# Patient Record
Sex: Male | Born: 1975 | Race: White | Hispanic: No | Marital: Married | Smoking: Current every day smoker
Health system: Southern US, Community
[De-identification: ages and names within clinical notes are randomized; demographics above are authoritative.]

## PROBLEM LIST (undated history)

## (undated) DIAGNOSIS — I10 Essential (primary) hypertension: Secondary | ICD-10-CM

## (undated) DIAGNOSIS — G8929 Other chronic pain: Secondary | ICD-10-CM

## (undated) DIAGNOSIS — M545 Low back pain: Secondary | ICD-10-CM

## (undated) DIAGNOSIS — F329 Major depressive disorder, single episode, unspecified: Secondary | ICD-10-CM

## (undated) DIAGNOSIS — J42 Unspecified chronic bronchitis: Secondary | ICD-10-CM

## (undated) DIAGNOSIS — E7800 Pure hypercholesterolemia, unspecified: Secondary | ICD-10-CM

## (undated) DIAGNOSIS — Z85028 Personal history of other malignant neoplasm of stomach: Secondary | ICD-10-CM

## (undated) DIAGNOSIS — R51 Headache: Secondary | ICD-10-CM

## (undated) DIAGNOSIS — J189 Pneumonia, unspecified organism: Secondary | ICD-10-CM

## (undated) DIAGNOSIS — F32A Depression, unspecified: Secondary | ICD-10-CM

## (undated) DIAGNOSIS — K219 Gastro-esophageal reflux disease without esophagitis: Secondary | ICD-10-CM

## (undated) DIAGNOSIS — F319 Bipolar disorder, unspecified: Secondary | ICD-10-CM

## (undated) DIAGNOSIS — M1990 Unspecified osteoarthritis, unspecified site: Secondary | ICD-10-CM

## (undated) DIAGNOSIS — F419 Anxiety disorder, unspecified: Secondary | ICD-10-CM

## (undated) DIAGNOSIS — T4145XA Adverse effect of unspecified anesthetic, initial encounter: Secondary | ICD-10-CM

## (undated) DIAGNOSIS — T8859XA Other complications of anesthesia, initial encounter: Secondary | ICD-10-CM

## (undated) DIAGNOSIS — R0602 Shortness of breath: Secondary | ICD-10-CM

## (undated) HISTORY — PX: CHOLECYSTECTOMY: SHX55

## (undated) HISTORY — PX: HIP SURGERY: SHX245

## (undated) HISTORY — PX: FINGER FRACTURE SURGERY: SHX638

## (undated) HISTORY — PX: MULTIPLE TOOTH EXTRACTIONS: SHX2053

---

## 2005-05-18 ENCOUNTER — Emergency Department (HOSPITAL_COMMUNITY): Admission: EM | Admit: 2005-05-18 | Discharge: 2005-05-18 | Payer: Self-pay | Admitting: Emergency Medicine

## 2006-09-23 ENCOUNTER — Emergency Department (HOSPITAL_COMMUNITY): Admission: EM | Admit: 2006-09-23 | Discharge: 2006-09-23 | Payer: Self-pay | Admitting: Emergency Medicine

## 2009-01-14 ENCOUNTER — Emergency Department (HOSPITAL_COMMUNITY): Admission: EM | Admit: 2009-01-14 | Discharge: 2009-01-14 | Payer: Self-pay | Admitting: Emergency Medicine

## 2009-01-14 ENCOUNTER — Ambulatory Visit (HOSPITAL_COMMUNITY): Admission: RE | Admit: 2009-01-14 | Discharge: 2009-01-14 | Payer: Self-pay | Admitting: Family Medicine

## 2011-02-01 LAB — COMPREHENSIVE METABOLIC PANEL
ALT: 143 — ABNORMAL HIGH
AST: 93 — ABNORMAL HIGH
Albumin: 3.6
Calcium: 8.7
Glucose, Bld: 143 — ABNORMAL HIGH
Total Bilirubin: 0.7

## 2011-02-01 LAB — CBC
HCT: 43.5
MCHC: 35.4
MCV: 90.1
Platelets: 256
RBC: 4.83
WBC: 13.6 — ABNORMAL HIGH

## 2011-02-01 LAB — DIFFERENTIAL
Eosinophils Relative: 4
Lymphs Abs: 2.4
Monocytes Relative: 5
Neutro Abs: 9.9 — ABNORMAL HIGH
Neutrophils Relative %: 73

## 2011-02-01 LAB — LIPASE, BLOOD: Lipase: 51

## 2011-02-01 LAB — URINALYSIS, ROUTINE W REFLEX MICROSCOPIC
Specific Gravity, Urine: >1.03 — ABNORMAL HIGH
Urobilinogen, UA: 2 — ABNORMAL HIGH

## 2013-07-06 ENCOUNTER — Encounter (HOSPITAL_COMMUNITY): Payer: Self-pay | Admitting: Emergency Medicine

## 2013-07-06 DIAGNOSIS — K089 Disorder of teeth and supporting structures, unspecified: Secondary | ICD-10-CM | POA: Insufficient documentation

## 2013-07-06 DIAGNOSIS — K029 Dental caries, unspecified: Secondary | ICD-10-CM | POA: Insufficient documentation

## 2013-07-06 DIAGNOSIS — F17200 Nicotine dependence, unspecified, uncomplicated: Secondary | ICD-10-CM | POA: Insufficient documentation

## 2013-07-06 DIAGNOSIS — K047 Periapical abscess without sinus: Secondary | ICD-10-CM | POA: Insufficient documentation

## 2013-07-06 NOTE — ED Notes (Signed)
Pt c/o dental pain since Friday. Pt states he broke his tooth in his sleep.

## 2013-07-07 ENCOUNTER — Emergency Department (HOSPITAL_COMMUNITY)
Admission: EM | Admit: 2013-07-07 | Discharge: 2013-07-07 | Disposition: A | Discharge status: Home / Self Care | Payer: Medicare Other | Attending: Emergency Medicine | Admitting: Emergency Medicine

## 2013-07-07 DIAGNOSIS — K0889 Other specified disorders of teeth and supporting structures: Secondary | ICD-10-CM

## 2013-07-07 MED ORDER — CLINDAMYCIN HCL 150 MG PO CAPS: 150.0000 mg | ORAL_CAPSULE | Freq: Four times a day (QID) | ORAL | Status: DC

## 2013-07-07 MED ORDER — HYDROCODONE-ACETAMINOPHEN 7.5-325 MG/15ML PO SOLN: 10.0000 mL | Freq: Four times a day (QID) | ORAL | Status: DC | PRN

## 2013-07-07 MED ORDER — IBUPROFEN 800 MG PO TABS: 800.0000 mg | ORAL_TABLET | Freq: Three times a day (TID) | ORAL | Status: DC

## 2013-07-07 MED ORDER — PENICILLIN V POTASSIUM 500 MG PO TABS: 500.0000 mg | ORAL_TABLET | Freq: Four times a day (QID) | ORAL | Status: AC

## 2013-07-07 NOTE — Discharge Instructions (Signed)
Dental Pain °A tooth ache may be caused by cavities (tooth decay). Cavities expose the nerve of the tooth to air and hot or cold temperatures. It may come from an infection or abscess (also called a boil or furuncle) around your tooth. It is also often caused by dental caries (tooth decay). This causes the pain you are having. °DIAGNOSIS  °Your caregiver can diagnose this problem by exam. °TREATMENT  °· If caused by an infection, it may be treated with medications which kill germs (antibiotics) and pain medications as prescribed by your caregiver. Take medications as directed. °· Only take over-the-counter or prescription medicines for pain, discomfort, or fever as directed by your caregiver. °· Whether the tooth ache today is caused by infection or dental disease, you should see your dentist as soon as possible for further care. °SEEK MEDICAL CARE IF: °The exam and treatment you received today has been provided on an emergency basis only. This is not a substitute for complete medical or dental care. If your problem worsens or new problems (symptoms) appear, and you are unable to meet with your dentist, call or return to this location. °SEEK IMMEDIATE MEDICAL CARE IF:  °· You have a fever. °· You develop redness and swelling of your face, jaw, or neck. °· You are unable to open your mouth. °· You have severe pain uncontrolled by pain medicine. °MAKE SURE YOU:  °· Understand these instructions. °· Will watch your condition. °· Will get help right away if you are not doing well or get worse. °Document Released: 04/02/2005 Document Revised: 06/25/2011 Document Reviewed: 11/19/2007 °ExitCare® Patient Information ©2014 [employer]. °  Emergency Department Resource Guide °1) Find a Doctor and Pay Out of Pocket °Although you won't have to find out who is covered by your insurance plan, it is a good idea to ask around and get recommendations. You will then need to call the office and see if the doctor you have chosen will  accept you as a new patient and what types of options they offer for patients who are self-pay. Some doctors offer discounts or will set up payment plans for their patients who do not have insurance, but you will need to ask so you aren't surprised when you get to your appointment. ° °2) Contact Your Local Health Department °Not all health departments have doctors that can see patients for sick visits, but many do, so it is worth a call to see if yours does. If you don't know where your local health department is, you can check in your phone book. The CDC also has a tool to help you locate your state's health department, and many state websites also have listings of all of their local health departments. ° °3) Find a Walk-in Clinic °If your illness is not likely to be very severe or complicated, you may want to try a walk in clinic. These are popping up all over the country in pharmacies, drugstores, and shopping centers. They're usually staffed by nurse practitioners or physician assistants that have been trained to treat common illnesses and complaints. They're usually fairly quick and inexpensive. However, if you have serious medical issues or chronic medical problems, these are probably not your best option. ° °No Primary Care Doctor: °- Call [facility] at  832-8000 - they can help you locate a primary care doctor that  accepts your insurance, provides certain services, etc. °- Physician Referral Service- 1-800-533-3463 ° °Chronic Pain Problems: °Organization           Address  Phone   Notes  °[facility]  (336) 297-2271 Patients need to be referred by their primary care doctor.  ° °Medication Assistance: °Organization         Address  Phone   Notes  °[place] [facility] [address] °[place], [place] [address] (336) 641-8030 --Must be a resident of [place] °-- Must have NO insurance coverage whatsoever (no Medicaid/ Medicare, etc.) °-- The pt.  MUST have a primary care doctor that directs their care regularly and follows them in the community °  °[facility]  (866) 331-1348   °[employer]  (888) 892-1162   ° °Agencies that provide inexpensive medical care: °Organization         Address  Phone   Notes  °[facility]  (336) 832-8035   °[facility]    (336) 832-7272   °[facility] [address] °[place], [place] [address] (336) 832-4777   °[facility] [address], °[place] (336) 271-4999   °[facility]    (336) 373-0678   °[facility]    (336) 272-1050   °[facility] ° [address], [place] Phone:  (336) 832-4444, Fax:  (336) 832-4440 Hours of Operation:  9 am - 6 pm, M-F.  Also accepts Medicaid/Medicare and self-pay.  °[facility] ° [address], [place] Phone: (336) 832-3150, Fax: (336) 832-3151. Hours of Operation:  8:30 am - 5:30 pm, M-F.  Also accepts Medicaid and self-pay.  °[facility] [place] [address], [place] Phone: (336) 878-6027   °[facility] [address], [place], [place] (336)723-1848, Ext. 123 Mondays & Thursdays: 7-9 AM.  First 15 patients are seen on a first come, first serve basis. °  ° °Medicaid-accepting [place] Providers: ° °Organization         Address  Phone   Notes  °[facility] [address], [place] (336) 641-2100 Also accepts self-pay patients.  °[facility] [address], [place] ° (336) 856-9996   °[facility] [address], [place] (336) 288-8857   °[facility] [address] [place] [address], [place] (336) 299-7000   °Veita Bland [address], [place]  ° (336) 373-1557 Only accepts [place] Access Medicaid patients after they have their name applied to their card.  ° °Self-Pay (no insurance) in  [place]: ° °Organization         Address  Phone   Notes  °Sickle Cell Patients, [facility] [address], [place] (336) 832-1970   °[facility] [address], [place] (336) 832-4400   ° [facility] ° [address], [place] (336) 992-4800   °[facility]/Dr. Osei-Bonsu ° [address] [place] [address], [place] or [address], [place] (336) 841-8500 Phone number for both [place] and [place] locations is the same.  °[facility] [address], [place] (336) 299-0000   °[facility] [address] [place] [address], [place] or [address] (336) 852-7530 °(336) 878-2260   °[facility] [address], [place] (336) 350-1642, phone; (336) 294-5005, fax Sees patients 1st and 3rd Saturday of every month.  Must not qualify   for public or private insurance (i.e. Medicaid, Medicare, [place] Health Choice, Veterans' Benefits) • Household income should be no more than 200% of the poverty level •The clinic cannot treat you if you are pregnant or think you are pregnant • Sexually transmitted diseases are not treated at the clinic.  ° °Dental Care: °Organization         Address  Phone  Notes  °[facility] [facility] [address], [place] (336) 641-6152 Accepts children up to age 21 who are enrolled in Medicaid or [place] Health Choice; pregnant women with a Medicaid card; and children who have applied for Medicaid or [place] Health Choice, but were declined, whose parents can pay a reduced fee at time of service.  °[facility] [place]  [address], [place] (336) 641-7733 Accepts children up to age 21 who are enrolled in Medicaid or [place] Health Choice; pregnant women with a Medicaid card; and children who have applied for Medicaid or [place] Health Choice, but were declined, whose parents  can pay a reduced fee at time of service.  °[facility] ° [address], [place] (336) 641-4533 Patients are seen by appointment only. Walk-ins are not accepted. [facility] will see patients 18 years of age and older. °Monday - Tuesday (8am-5pm) °Most Wednesdays (8:30-5pm) °$30 per visit, cash only  °[facility] ° [address], [place] (336) 641-4533 Patients are seen by appointment only. Walk-ins are not accepted. [facility] will see patients 18 years of age and older. °One Wednesday Evening (Monthly: Volunteer Based).  $30 per visit, cash only  °[facility]  (919) 537-3737 for adults; Children under age 4, call Graduate Pediatric Dentistry at (919) [phone]. Children aged 4-14, please call (919) 537-3737 to request a pediatric application. ° Dental services are provided in all areas of dental care including fillings, crowns and bridges, complete and partial dentures, implants, gum treatment, root canals, and extractions. Preventive care is also provided. Treatment is provided to both adults and children. °Patients are selected via a lottery and there is often a waiting list. °  °[facility] [address], °[place] ° (336) 763-8833 [email or web address].drcivils.com °  °[facility] [address], [place], [place] (336)723-1848, Ext. 123 Second and Fourth Thursday of each month, opens at 6:30 AM; Clinic ends at 9 AM.  Patients are seen on a first-come first-served basis, and a limited number are seen during each clinic.  ° °[facility] ° [address], [place], [place] (336) 723-7904   Eligibility Requirements °You must have lived in [place], [place], or [place] counties for at least the last three months. °  You cannot be eligible for state or federal sponsored healthcare insurance, including Veterans Administration, Medicaid, or Medicare. °  You generally cannot be eligible for healthcare  insurance through your employer.  °  How to apply: °Eligibility screenings are held every Tuesday and Wednesday afternoon from 1:00 pm until 4:00 pm. You do not need an appointment for the interview!  °[facility] [address], [place], [place] 336-631-2330   °[facility] [facility]  336-342-8273   °[facility]  336-703-3100   °[facility]  336-570-6415   ° °Behavioral Health Resources in the Community: °Intensive Outpatient Programs °Organization         Address  Phone    Notes  °[facility] [address], [place], [place] 336-878-6098   °[facility] [address], [place], [place] 336-832-9800   °[facility] [address], [place], [place] ° 336-882-2125   °[facility] [address],  °[place], [place] 1-800-853-5163 or 336-641-4981   °Substance Abuse Resources °Organization         Address  Phone  Notes  °[facility]  336-882-2125   °[facility]  336-784-9470   °[facility]  336-285-9073   °[facility]  336-845-3988   °Residential & Outpatient Substance Abuse Program  1-800-659-3381   °Psychological Services °Organization         Address  Phone  Notes  °[facility]  336- 832-9600   °[facility]  336- 378-7881   °[facility] [address], [place] 1-800-853-5163 or 336-641-4981   ° °Mobile Crisis Teams °Organization         Address  Phone  Notes  °[facility], Mobile Crisis Care Unit  1-877-626-1772   °Assertive °[facility] ° [address] [place], [place] 336-834-9664   °Sharon DeEsch [address] °[place] 336-554-5454   ° °Self-Help/Support Groups °Organization         Address  Phone             Notes  °[facility] - variety of support groups  336- 373-1402 Call for more information  °Narcotics Anonymous (NA),  [facility] [address], °[place]  2 meetings at this location  ° °Residential Treatment Programs °Organization         Address  Phone  Notes  °[facility] [address],    °[place]  1-866-801-8205   °[facility] ° [address], [place], [place] 704-293-8524   °[facility] [address], [place] 336-845-3988 Admissions: 8am-3pm M-F  °[facility] [address]    °[place], [place] 336-841-1104   °[facility] [address], [place], [place] 336-379-7146   °[facility] [address]  °[place], [place] 336-285-9073   °[facility] Intensive Outpatient [address], [place], [place] 336-852-3033   °[facility] [address]  °[place], [place] 1-877-615-2722 or 336-784-9470   °[facility] ([facility] [address] [place], [place] 336-227-7417 Accepts Medicaid  °[facility] [address]  ° [place] 1-800-659-3381 Substance Abuse/Addiction Treatment  ° °[place] Behavioral Health Resources °Organization         Address  Phone  Notes  °[facility]  (888) 581-9988   °Julie Brannon, PhD [address] [place], [place]   (336) 349-5553 or (336) 951-0000   °[facility]   [address] °[place], [place] (336) 349-4454   °[facility] [address], [place], [place] (336) 342-8316 Insurance/Medicaid/sponsorship through [facility]  °[facility] [address]                                    [place],  (336) 342-8316 Therapy/tele-psych/case  °[facility] [address]  ° [place],  (336) 349-2233    °Dr. Arfeen  (336) 349-4544   °[facility] [place]  [employer] [place]   [facility] 1) [address], [place] °2) [address], [place] °3)  [address], [place] (336) 349-3220 °(336) 342-7768 ° °(336) 342-8140     °[place] [facility] (336) 342-1394 or (336) 342-3537 (After Hours)    ° °   °

## 2013-07-07 NOTE — ED Notes (Signed)
Patient given discharge instruction, verbalized understand. Patient ambulatory out of the department.  

## 2013-07-07 NOTE — ED Notes (Signed)
MD at the bedside with dental box, to numb pt , wife at the bedside.
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
## 2013-09-23 NOTE — H&P (Signed)
HISTORY AND PHYSICAL  Kevin Daugherty is a 38 y.o. male patient with BZ:MCEYEMV teeth  No diagnosis found.  No past medical history on file.  No current facility-administered medications for this encounter.   Current Outpatient Prescriptions  Medication Sig Dispense Refill  . clindamycin (CLEOCIN) 150 MG capsule Take 1 capsule (150 mg total) by mouth every 6 (six) hours.  28 capsule  0  . HYDROcodone-acetaminophen (HYCET) 7.5-325 mg/15 ml solution Take 10 mLs by mouth every 6 (six) hours as needed for moderate pain or severe pain.  60 mL  0  . ibuprofen (ADVIL,MOTRIN) 800 MG tablet Take 1 tablet (800 mg total) by mouth 3 (three) times daily.  21 tablet  0  . ibuprofen (ADVIL,MOTRIN) 800 MG tablet Take 1 tablet (800 mg total) by mouth 3 (three) times daily.  21 tablet  0   Allergies  Allergen Reactions  . Dilaudid [Hydromorphone] Nausea Only, Swelling and Palpitations  . Penicillins Swelling   Active Problems:   * No active hospital problems. *  Vitals: There were no vitals taken for this visit. Lab results:No results found for this or any previous visit (from the past 24 hour(s)). Radiology Results: No results found. General appearance: alert, cooperative and morbidly obese Head: Normocephalic, without obvious abnormality, atraumatic Eyes: negative Ears: normal TM's and external ear canals both ears Nose: Nares normal. Septum midline. Mucosa normal. No drainage or sinus tenderness. Throat: Severe dental caries and periodontal disease all remaining teeth #'s 3, 4, 6, 7, 8, 9, 12, 13, 14, 20, 21, 22, 23, 24, 25, 26, 27, 28, 29 Neck: no adenopathy, supple, symmetrical, trachea midline and thyroid not enlarged, symmetric, no tenderness/mass/nodules Resp: clear to auscultation bilaterally Cardio: regular rate and rhythm, S1, S2 normal, no murmur, click, rub or gallop  Assessment:Non-restorable  teeth #'s 3, 4, 6, 7, 8, 9, 12, 13, 14, 20, 21, 22, 23, 24, 25, 26, 27, 28,  29  Plan:Extraction  teeth #'s 3, 4, 6, 7, 8, 9, 12, 13, 14, 20, 21, 22, 23, 24, 25, 26, 27, 28, 29. Alveoloplasty. General anesthesia. Day surgery.   Kevin Daugherty M 09/23/2013

## 2013-09-24 ENCOUNTER — Encounter (HOSPITAL_COMMUNITY): Payer: Self-pay | Admitting: Pharmacy Technician

## 2013-09-25 ENCOUNTER — Encounter (HOSPITAL_COMMUNITY)
Admission: RE | Admit: 2013-09-25 | Discharge: 2013-09-25 | Disposition: A | Discharge status: Home / Self Care | Payer: Medicare Other | Source: Ambulatory Visit | Attending: Anesthesiology | Admitting: Anesthesiology

## 2013-09-25 ENCOUNTER — Encounter (HOSPITAL_COMMUNITY)
Admission: RE | Admit: 2013-09-25 | Discharge: 2013-09-25 | Disposition: A | Discharge status: Home / Self Care | Payer: Medicare Other | Source: Ambulatory Visit | Attending: Oral Surgery | Admitting: Oral Surgery

## 2013-09-25 ENCOUNTER — Encounter (HOSPITAL_COMMUNITY): Payer: Self-pay

## 2013-09-25 DIAGNOSIS — Z01818 Encounter for other preprocedural examination: Secondary | ICD-10-CM | POA: Insufficient documentation

## 2013-09-25 DIAGNOSIS — Z01812 Encounter for preprocedural laboratory examination: Secondary | ICD-10-CM | POA: Diagnosis not present

## 2013-09-25 HISTORY — DX: Shortness of breath: R06.02

## 2013-09-25 HISTORY — DX: Depression, unspecified: F32.A

## 2013-09-25 HISTORY — DX: Gastro-esophageal reflux disease without esophagitis: K21.9

## 2013-09-25 HISTORY — DX: Other complications of anesthesia, initial encounter: T88.59XA

## 2013-09-25 HISTORY — DX: Major depressive disorder, single episode, unspecified: F32.9

## 2013-09-25 HISTORY — DX: Bipolar disorder, unspecified: F31.9

## 2013-09-25 HISTORY — DX: Adverse effect of unspecified anesthetic, initial encounter: T41.45XA

## 2013-09-25 HISTORY — DX: Essential (primary) hypertension: I10

## 2013-09-25 HISTORY — DX: Anxiety disorder, unspecified: F41.9

## 2013-09-25 LAB — CBC
HEMATOCRIT: 44.5 % (ref 39.0–52.0)
Hemoglobin: 15.4 g/dL (ref 13.0–17.0)
MCH: 31.1 pg (ref 26.0–34.0)
MCHC: 34.6 g/dL (ref 30.0–36.0)
MCV: 89.9 fL (ref 78.0–100.0)
Platelets: 204 10*3/uL (ref 150–400)
RBC: 4.95 MIL/uL (ref 4.22–5.81)
RDW: 12.9 % (ref 11.5–15.5)
WBC: 9 10*3/uL (ref 4.0–10.5)

## 2013-09-25 LAB — BASIC METABOLIC PANEL
BUN: 10 mg/dL (ref 6–23)
CO2: 25 meq/L (ref 19–32)
CREATININE: 0.77 mg/dL (ref 0.50–1.35)
Calcium: 9.3 mg/dL (ref 8.4–10.5)
Chloride: 103 mEq/L (ref 96–112)
GFR calc Af Amer: >90 mL/min (ref >90)
GFR calc non Af Amer: >90 mL/min (ref >90)
Glucose, Bld: 156 mg/dL — ABNORMAL HIGH (ref 70–99)
POTASSIUM: 4.2 meq/L (ref 3.7–5.3)
Sodium: 142 mEq/L (ref 137–147)

## 2013-09-25 NOTE — Progress Notes (Signed)
Pt. States that he has taken medication in the past for HTN and Bipolar. He quit taking some months ago because he could not afford to go to the doctor.He now has Medicaid and has an appointment next month to go see his doctor.  Requested OV,Stress test,EKG and any other cardiac testing [facility] [place] Pt. States that he had these test done last year.

## 2013-09-25 NOTE — Pre-Procedure Instructions (Signed)
Kevin Daugherty  09/25/2013   Your procedure is scheduled on:  10-02-2013  Friday     [facility] 6:45 AM.   Call this number if you have problems the morning of surgery: [phone]   Remember:   Do not eat food or drink liquids after midnight.    Take these medicines the morning of surgery with A SIP OF WATER: none   Do not wear jewelry  Do not wear lotions, powder.  Do not shave 48 hours prior to surgery. Men may shave face and neck.  Do not bring valuables to the hospital.  [facility] is not responsible  for any belongings or valuables.               Contacts, dentures or bridgework may not be worn into surgery.                  Patients discharged the day of surgery will not be allowed to drivehome.   Name and phone number of your driver: wife Kevin Daugherty   Special Instructions: See attached sheet for instructions on CHG shower/bath   Please read over the following fact sheets that you were given: Pain Booklet and Surgical Site Infection Prevention

## 2013-10-01 MED ORDER — CLINDAMYCIN PHOSPHATE 600 MG/50ML IV SOLN
600.0000 mg | Freq: Four times a day (QID) | INTRAVENOUS | Status: DC
  Administered 2013-10-02: 600 mg via INTRAVENOUS
  Filled 2013-10-01: qty 50

## 2013-10-02 ENCOUNTER — Observation Stay (HOSPITAL_COMMUNITY)
Admission: RE | Admit: 2013-10-02 | Discharge: 2013-10-03 | Disposition: A | Discharge status: Home / Self Care | Payer: Medicare Other | Source: Ambulatory Visit | Attending: Internal Medicine | Admitting: Internal Medicine

## 2013-10-02 ENCOUNTER — Encounter (HOSPITAL_COMMUNITY): Payer: Self-pay | Admitting: *Deleted

## 2013-10-02 ENCOUNTER — Encounter (HOSPITAL_COMMUNITY)
Admission: RE | Disposition: A | Discharge status: Home / Self Care | Payer: Self-pay | Source: Ambulatory Visit | Attending: Internal Medicine

## 2013-10-02 ENCOUNTER — Encounter (HOSPITAL_COMMUNITY): Payer: Medicare Other | Admitting: Certified Registered Nurse Anesthetist

## 2013-10-02 ENCOUNTER — Ambulatory Visit (HOSPITAL_COMMUNITY): Payer: Medicare Other | Admitting: Certified Registered Nurse Anesthetist

## 2013-10-02 DIAGNOSIS — F411 Generalized anxiety disorder: Secondary | ICD-10-CM | POA: Insufficient documentation

## 2013-10-02 DIAGNOSIS — K029 Dental caries, unspecified: Principal | ICD-10-CM | POA: Insufficient documentation

## 2013-10-02 DIAGNOSIS — F319 Bipolar disorder, unspecified: Secondary | ICD-10-CM | POA: Insufficient documentation

## 2013-10-02 DIAGNOSIS — Z6841 Body Mass Index (BMI) 40.0 and over, adult: Secondary | ICD-10-CM | POA: Diagnosis not present

## 2013-10-02 DIAGNOSIS — I10 Essential (primary) hypertension: Secondary | ICD-10-CM | POA: Diagnosis not present

## 2013-10-02 DIAGNOSIS — K08109 Complete loss of teeth, unspecified cause, unspecified class: Secondary | ICD-10-CM

## 2013-10-02 DIAGNOSIS — K0530 Chronic periodontitis, unspecified: Secondary | ICD-10-CM

## 2013-10-02 DIAGNOSIS — F17200 Nicotine dependence, unspecified, uncomplicated: Secondary | ICD-10-CM | POA: Insufficient documentation

## 2013-10-02 DIAGNOSIS — K219 Gastro-esophageal reflux disease without esophagitis: Secondary | ICD-10-CM | POA: Diagnosis not present

## 2013-10-02 DIAGNOSIS — K08409 Partial loss of teeth, unspecified cause, unspecified class: Secondary | ICD-10-CM | POA: Diagnosis present

## 2013-10-02 HISTORY — DX: Low back pain: M54.5

## 2013-10-02 HISTORY — DX: Other chronic pain: G89.29

## 2013-10-02 HISTORY — DX: Unspecified chronic bronchitis: J42

## 2013-10-02 HISTORY — PX: MULTIPLE EXTRACTIONS WITH ALVEOLOPLASTY: SHX5342

## 2013-10-02 HISTORY — DX: Unspecified osteoarthritis, unspecified site: M19.90

## 2013-10-02 HISTORY — DX: Pneumonia, unspecified organism: J18.9

## 2013-10-02 HISTORY — DX: Personal history of other malignant neoplasm of stomach: Z85.028

## 2013-10-02 HISTORY — DX: Headache: R51

## 2013-10-02 HISTORY — DX: Pure hypercholesterolemia, unspecified: E78.00

## 2013-10-02 SURGERY — MULTIPLE EXTRACTION WITH ALVEOLOPLASTY
Anesthesia: General | Site: Mouth

## 2013-10-02 MED ORDER — MORPHINE SULFATE 4 MG/ML IJ SOLN
4.0000 mg | INTRAMUSCULAR | Status: DC | PRN
  Administered 2013-10-02 (×2): 4 mg via INTRAVENOUS

## 2013-10-02 MED ORDER — FENTANYL CITRATE 0.05 MG/ML IJ SOLN
INTRAMUSCULAR | Status: AC
  Filled 2013-10-02: qty 5

## 2013-10-02 MED ORDER — MIDAZOLAM HCL 2 MG/2ML IJ SOLN
2.0000 mg | Freq: Once | INTRAMUSCULAR | Status: AC
  Administered 2013-10-02: 2 mg via INTRAVENOUS

## 2013-10-02 MED ORDER — ACETAMINOPHEN 325 MG PO TABS
650.0000 mg | ORAL_TABLET | Freq: Four times a day (QID) | ORAL | Status: DC | PRN
Start: 2013-10-02 — End: 2013-10-03

## 2013-10-02 MED ORDER — 0.9 % SODIUM CHLORIDE (POUR BTL) OPTIME
TOPICAL | Status: DC | PRN
  Administered 2013-10-02: 1000 mL

## 2013-10-02 MED ORDER — FENTANYL CITRATE 0.05 MG/ML IJ SOLN
50.0000 ug | INTRAMUSCULAR | Status: AC | PRN
  Administered 2013-10-02 (×3): 50 ug via INTRAVENOUS

## 2013-10-02 MED ORDER — ONDANSETRON HCL 4 MG/2ML IJ SOLN
INTRAMUSCULAR | Status: DC | PRN
  Administered 2013-10-02: 4 mg via INTRAVENOUS

## 2013-10-02 MED ORDER — PROPOFOL 10 MG/ML IV BOLUS
INTRAVENOUS | Status: DC | PRN
  Administered 2013-10-02: 200 mg via INTRAVENOUS
  Administered 2013-10-02: 50 mg via INTRAVENOUS

## 2013-10-02 MED ORDER — MIDAZOLAM HCL 5 MG/5ML IJ SOLN
INTRAMUSCULAR | Status: DC | PRN
  Administered 2013-10-02: 2 mg via INTRAVENOUS

## 2013-10-02 MED ORDER — HYDROMORPHONE HCL PF 1 MG/ML IJ SOLN
INTRAMUSCULAR | Status: AC
  Filled 2013-10-02: qty 2

## 2013-10-02 MED ORDER — OXYCODONE HCL 5 MG/5ML PO SOLN
ORAL | Status: AC
  Filled 2013-10-02: qty 5

## 2013-10-02 MED ORDER — MIDAZOLAM HCL 2 MG/2ML IJ SOLN
INTRAMUSCULAR | Status: AC
  Filled 2013-10-02: qty 2

## 2013-10-02 MED ORDER — PROPOFOL 10 MG/ML IV BOLUS
INTRAVENOUS | Status: AC
  Filled 2013-10-02: qty 20

## 2013-10-02 MED ORDER — LIDOCAINE-EPINEPHRINE 2 %-1:100000 IJ SOLN
INTRAMUSCULAR | Status: DC | PRN
  Administered 2013-10-02: 20 mL

## 2013-10-02 MED ORDER — NICOTINE 14 MG/24HR TD PT24
14.0000 mg | MEDICATED_PATCH | Freq: Every day | TRANSDERMAL | Status: DC
  Administered 2013-10-02 – 2013-10-03 (×2): 14 mg via TRANSDERMAL
  Filled 2013-10-02 (×3): qty 1

## 2013-10-02 MED ORDER — KETOROLAC TROMETHAMINE 30 MG/ML IJ SOLN
30.0000 mg | Freq: Four times a day (QID) | INTRAMUSCULAR | Status: DC | PRN
  Administered 2013-10-02 – 2013-10-03 (×2): 30 mg via INTRAVENOUS
  Filled 2013-10-02 (×2): qty 1

## 2013-10-02 MED ORDER — SUCCINYLCHOLINE CHLORIDE 20 MG/ML IJ SOLN
INTRAMUSCULAR | Status: DC | PRN
  Administered 2013-10-02: 140 mg via INTRAVENOUS

## 2013-10-02 MED ORDER — FENTANYL CITRATE 0.05 MG/ML IJ SOLN
INTRAMUSCULAR | Status: DC | PRN
  Administered 2013-10-02: 50 ug via INTRAVENOUS
  Administered 2013-10-02: 100 ug via INTRAVENOUS

## 2013-10-02 MED ORDER — CHLORHEXIDINE GLUCONATE 0.12 % MT SOLN
15.0000 mL | Freq: Four times a day (QID) | OROMUCOSAL | Status: DC
  Administered 2013-10-02 – 2013-10-03 (×2): 15 mL via OROMUCOSAL
  Filled 2013-10-02 (×2): qty 15

## 2013-10-02 MED ORDER — DOCUSATE SODIUM 100 MG PO CAPS
100.0000 mg | ORAL_CAPSULE | Freq: Two times a day (BID) | ORAL | Status: DC
  Administered 2013-10-02 – 2013-10-03 (×2): 100 mg via ORAL
  Filled 2013-10-02 (×2): qty 1

## 2013-10-02 MED ORDER — HYDRALAZINE HCL 20 MG/ML IJ SOLN
10.0000 mg | Freq: Four times a day (QID) | INTRAMUSCULAR | Status: DC
  Administered 2013-10-02 – 2013-10-03 (×3): 10 mg via INTRAVENOUS
  Filled 2013-10-02 (×2): qty 1
  Filled 2013-10-02 (×3): qty 0.5
  Filled 2013-10-02: qty 1
  Filled 2013-10-02: qty 0.5

## 2013-10-02 MED ORDER — ONDANSETRON HCL 4 MG PO TABS
4.0000 mg | ORAL_TABLET | Freq: Four times a day (QID) | ORAL | Status: DC | PRN
Start: 2013-10-02 — End: 2013-10-03

## 2013-10-02 MED ORDER — FENTANYL CITRATE 0.05 MG/ML IJ SOLN
INTRAMUSCULAR | Status: AC
  Administered 2013-10-02: 25 ug via INTRAVENOUS
  Filled 2013-10-02: qty 2

## 2013-10-02 MED ORDER — LIDOCAINE HCL (CARDIAC) 20 MG/ML IV SOLN
INTRAVENOUS | Status: DC | PRN
  Administered 2013-10-02: 100 mg via INTRAVENOUS

## 2013-10-02 MED ORDER — ALUM & MAG HYDROXIDE-SIMETH 200-200-20 MG/5ML PO SUSP: 30.0000 mL | Freq: Four times a day (QID) | ORAL | Status: DC | PRN

## 2013-10-02 MED ORDER — OXYCODONE HCL 5 MG PO TABS
5.0000 mg | ORAL_TABLET | ORAL | Status: DC | PRN
  Administered 2013-10-03: 10 mg via ORAL
  Filled 2013-10-02: qty 2

## 2013-10-02 MED ORDER — MORPHINE SULFATE 4 MG/ML IJ SOLN
INTRAMUSCULAR | Status: AC
  Filled 2013-10-02: qty 1

## 2013-10-02 MED ORDER — BUPIVACAINE-EPINEPHRINE 0.5% -1:200000 IJ SOLN
50.0000 mL | Freq: Once | INTRAMUSCULAR | Status: DC
  Filled 2013-10-02: qty 50

## 2013-10-02 MED ORDER — FENTANYL CITRATE 0.05 MG/ML IJ SOLN
INTRAMUSCULAR | Status: AC
  Filled 2013-10-02: qty 2

## 2013-10-02 MED ORDER — MORPHINE SULFATE 4 MG/ML IJ SOLN
4.0000 mg | INTRAMUSCULAR | Status: AC | PRN
  Administered 2013-10-02 – 2013-10-03 (×3): 4 mg via INTRAVENOUS
  Filled 2013-10-02 (×3): qty 1

## 2013-10-02 MED ORDER — BUPIVACAINE HCL (PF) 0.5 % IJ SOLN
30.0000 mL | Freq: Once | INTRAMUSCULAR | Status: DC
  Filled 2013-10-02: qty 30

## 2013-10-02 MED ORDER — ONDANSETRON HCL 4 MG/2ML IJ SOLN
4.0000 mg | Freq: Four times a day (QID) | INTRAMUSCULAR | Status: DC | PRN
  Administered 2013-10-02: 4 mg via INTRAVENOUS
  Filled 2013-10-02: qty 2

## 2013-10-02 MED ORDER — LORAZEPAM 2 MG/ML IJ SOLN: 1.0000 mg | Freq: Four times a day (QID) | INTRAMUSCULAR | Status: DC | PRN

## 2013-10-02 MED ORDER — OXYCODONE-ACETAMINOPHEN 10-325 MG PO TABS: 1.0000 | ORAL_TABLET | ORAL | Status: DC | PRN

## 2013-10-02 MED ORDER — LIDOCAINE HCL (CARDIAC) 20 MG/ML IV SOLN
INTRAVENOUS | Status: AC
  Filled 2013-10-02: qty 5

## 2013-10-02 MED ORDER — LACTATED RINGERS IV SOLN
INTRAVENOUS | Status: DC
  Administered 2013-10-02: 07:00:00 via INTRAVENOUS

## 2013-10-02 MED ORDER — ARTIFICIAL TEARS OP OINT
TOPICAL_OINTMENT | OPHTHALMIC | Status: DC | PRN
  Administered 2013-10-02: 1 via OPHTHALMIC

## 2013-10-02 MED ORDER — FENTANYL CITRATE 0.05 MG/ML IJ SOLN
25.0000 ug | INTRAMUSCULAR | Status: DC | PRN
  Administered 2013-10-02 (×4): 25 ug via INTRAVENOUS
  Administered 2013-10-02: 50 ug via INTRAVENOUS

## 2013-10-02 MED ORDER — LIDOCAINE-EPINEPHRINE 2 %-1:100000 IJ SOLN
INTRAMUSCULAR | Status: AC
  Filled 2013-10-02: qty 1

## 2013-10-02 MED ORDER — NICOTINE 21 MG/24HR TD PT24: 21.0000 mg | MEDICATED_PATCH | Freq: Every day | TRANSDERMAL | Status: AC

## 2013-10-02 MED ORDER — OXYCODONE HCL 5 MG/5ML PO SOLN
5.0000 mg | Freq: Once | ORAL | Status: AC
  Administered 2013-10-02: 5 mg via ORAL

## 2013-10-02 MED ORDER — ACETAMINOPHEN 650 MG RE SUPP: 650.0000 mg | Freq: Four times a day (QID) | RECTAL | Status: DC | PRN

## 2013-10-02 MED ORDER — OXYMETAZOLINE HCL 0.05 % NA SOLN
NASAL | Status: DC | PRN
  Administered 2013-10-02: 2 via NASAL

## 2013-10-02 MED ORDER — ONDANSETRON HCL 4 MG/2ML IJ SOLN
INTRAMUSCULAR | Status: AC
  Filled 2013-10-02: qty 2

## 2013-10-02 MED ORDER — LACTATED RINGERS IV SOLN
INTRAVENOUS | Status: DC | PRN
  Administered 2013-10-02: 09:00:00 via INTRAVENOUS

## 2013-10-02 MED ORDER — SENNA 8.6 MG PO TABS
1.0000 | ORAL_TABLET | Freq: Two times a day (BID) | ORAL | Status: DC
  Administered 2013-10-02 – 2013-10-03 (×2): 8.6 mg via ORAL
  Filled 2013-10-02 (×3): qty 1

## 2013-10-02 SURGICAL SUPPLY — 30 items
BLADE 10 SAFETY STRL DISP (BLADE) ×1 IMPLANT
BUR CROSS CUT FISSURE 1.6 (BURR) ×2 IMPLANT
BUR CROSS CUT FISSURE 1.6MM (BURR) ×1
BUR EGG ELITE 4.0 (BURR) ×2 IMPLANT
BUR EGG ELITE 4.0MM (BURR) ×1
CANISTER SUCTION 2500CC (MISCELLANEOUS) ×3 IMPLANT
COVER SURGICAL LIGHT HANDLE (MISCELLANEOUS) ×3 IMPLANT
CRADLE DONUT ADULT HEAD (MISCELLANEOUS) ×3 IMPLANT
GAUZE PACKING FOLDED 2  STR (GAUZE/BANDAGES/DRESSINGS) ×2
GAUZE PACKING FOLDED 2 STR (GAUZE/BANDAGES/DRESSINGS) ×1 IMPLANT
GLOVE BIO SURGEON STRL SZ 6.5 (GLOVE) ×2 IMPLANT
GLOVE BIO SURGEON STRL SZ7.5 (GLOVE) ×3 IMPLANT
GLOVE BIO SURGEONS STRL SZ 6.5 (GLOVE)
GLOVE BIOGEL PI IND STRL 7.0 (GLOVE) ×2 IMPLANT
GLOVE BIOGEL PI INDICATOR 7.0 (GLOVE)
GOWN STRL REUS W/ TWL LRG LVL3 (GOWN DISPOSABLE) ×2 IMPLANT
GOWN STRL REUS W/ TWL XL LVL3 (GOWN DISPOSABLE) ×1 IMPLANT
GOWN STRL REUS W/TWL LRG LVL3 (GOWN DISPOSABLE) ×3
GOWN STRL REUS W/TWL XL LVL3 (GOWN DISPOSABLE) ×3
KIT BASIN OR (CUSTOM PROCEDURE TRAY) ×3 IMPLANT
KIT ROOM TURNOVER OR (KITS) ×3 IMPLANT
NEEDLE 22X1 1/2 (OR ONLY) (NEEDLE) ×3 IMPLANT
NS IRRIG 1000ML POUR BTL (IV SOLUTION) ×3 IMPLANT
PAD ARMBOARD 7.5X6 YLW CONV (MISCELLANEOUS) ×4 IMPLANT
SUT CHROMIC 3 0 PS 2 (SUTURE) ×6 IMPLANT
SYR CONTROL 10ML LL (SYRINGE) ×3 IMPLANT
TOWEL OR 17X26 10 PK STRL BLUE (TOWEL DISPOSABLE) ×3 IMPLANT
TRAY ENT MC OR (CUSTOM PROCEDURE TRAY) ×3 IMPLANT
TUBING IRRIGATION (MISCELLANEOUS) ×3 IMPLANT
YANKAUER SUCT BULB TIP NO VENT (SUCTIONS) ×3 IMPLANT

## 2013-10-02 NOTE — Op Note (Signed)
NAME:  Loma MessingMILLER, Filemon                 ACCOUNT NO.:  [id number]  MEDICAL RECORD NO.:  [id number]  LOCATION:  [facility]                         FACILITY:  [facility]  PHYSICIAN:  Georgia LopesScott M. Jensen, M.D.  DATE OF BIRTH:  22-Nov-1975  DATE OF PROCEDURE:  10/02/2013 DATE OF DISCHARGE:                              OPERATIVE REPORT   PREOPERATIVE DIAGNOSIS:  Nonrestorable teeth #3, 4, 6, 7, 8, 9, 12, 13, 14, 20, 21, 22, 23, 24, 25, 26, 27, 28, 29.  POSTOPERATIVE DIAGNOSIS:  Nonrestorable teeth #3, 4, 6, 7, 8, 9, 12, 13, 14, 20, 21, 22, 23, 24, 25, 26, 27, 28, 29.  PROCEDURE:  Extraction of teeth #3, 4, 6, 7, 8, 9, 12, 13, 14, 20, 21, 22, 23, 24, 25, 26, 27, 28, 29.  Alveoplasty right and left maxilla and mandible.  SURGEON:  Georgia LopesScott M. Jensen, MD  ANESTHESIA:  General, nasal intubation, Dr. Randa EvensEdwards attending.  DESCRIPTION OF PROCEDURE:  The patient was taken to the operating room, placed on the table in supine position.  General anesthesia was administered intravenously and a nasal endotracheal tube was placed and secured.  The eyes were protected.  The patient was draped for the procedure.  Time-out was performed.  The posterior pharynx was suctioned.  A throat pack was placed.  A 2% lidocaine with 1:100,000 epinephrine was infiltrated in an inferior alveolar block on the right and left side and a buccal and palatal infiltration in the maxilla. Total of 18 mL was utilized.  A bite block was placed in the right side of the mouth and a sweetheart retractor was used to retract the tongue. Then, a 15 blade was used to make an incision beginning in the left mandible, approximately 1 cm proximal to tooth #20 on the alveolar crest and then carried around buccally and lingually in the gingival sulcus to tooth #26 in the maxilla.  The 15 blade was used to make an incision beginning at tooth #14 and carrying anteriorly to tooth #6 on the buccal and palatal surfaces underneath the gingival sulcus.  Then,  the periosteal elevator was used to reflect the periosteum.  The teeth were elevated with a 301 elevator.  The lower teeth were removed with Asch forceps and the upper teeth were removed with the #150 dental forceps. The socket was then curetted and then the periosteum was further reflected to expose the alveolar crest, then the alveoplasty was performed using the egg-shaped bur and bone file under irrigation. Then, the area was irrigated and sutured with 3-0 chromic.  A larger bite block was then placed in the mouth and a sweetheart retractor was used to retract the tongue for access to the right side.  The 15 blade was used to make an incision around teeth #27, 28, and 29 in the mandible and around teeth #3, 4, and connected to the previous incision at tooth #6.  The periosteum was reflected.  The teeth were elevated with a 301 elevator and removed from the mouth with dental forceps.  The sockets were then curetted and the alveolar crestal tissue was exposed using the periosteal elevator.  Alveoplasty was then performed using the egg-shaped bur and  bone file.  Then, the area was irrigated and closed with 3-0 chromic.  The oral cavity was inspected and found to have good contour, hemostasis, and closure.  The oral cavity was irrigated and suctioned.  Throat pack was removed.  The patient was awakened and taken to the recovery room, breathing spontaneously in good condition.  ESTIMATED BLOOD LOSS:  Minimal.  COMPLICATIONS:  None.  SPECIMENS:  None.     Georgia LopesScott M. Jensen, M.D.     SMJ/MEDQ  D:  10/02/2013  T:  10/02/2013  Job:  [id number]

## 2013-10-02 NOTE — Anesthesia Preprocedure Evaluation (Addendum)
Anesthesia Evaluation  Patient identified by MRN, date of birth, ID band Patient awake    Reviewed: Allergy & Precautions, H&P , NPO status , Patient's Chart, lab work & pertinent test results  Airway Mallampati: II TM Distance: >3 FB     Dental  (+) Dental Advisory Given, Poor Dentition   Pulmonary shortness of breath, Current Smoker,  breath sounds clear to auscultation        Cardiovascular hypertension, Rhythm:Regular Rate:Normal     Neuro/Psych    GI/Hepatic GERD-  ,  Endo/Other    Renal/GU      Musculoskeletal   Abdominal   Peds  Hematology   Anesthesia Other Findings   Reproductive/Obstetrics                          Anesthesia Physical Anesthesia Plan  ASA: III  Anesthesia Plan: General   Post-op Pain Management:    Induction: Intravenous  Airway Management Planned: Nasal ETT  Additional Equipment:   Intra-op Plan:   Post-operative Plan: Extubation in OR  Informed Consent: I have reviewed the patients History and Physical, chart, labs and discussed the procedure including the risks, benefits and alternatives for the proposed anesthesia with the patient or authorized representative who has indicated his/her understanding and acceptance.   Dental advisory given  Plan Discussed with: CRNA, Anesthesiologist and Surgeon  Anesthesia Plan Comments:         Anesthesia Quick Evaluation

## 2013-10-02 NOTE — H&P (Signed)
H&P documentation  -History and Physical Reviewed  -Patient has been re-examined  -No change in the plan of care  JENSEN,SCOTT M  

## 2013-10-02 NOTE — OR Nursing (Signed)
Pt admitted for observation to manage persistant, refractory pain.  Have consulted anesthesia and Dr Barbette MerinoJensen multiple times and decision made to admit patient for aggressive pain management.
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
## 2013-10-02 NOTE — Discharge Instructions (Signed)
What to Eat after Tooth extraction:   ° ° °For your first meals, you should eat lightly; only small meals at first.   Avoid Sharp, Crunchy, and Hot foods.   If you do not have nausea, you may eat larger meals.  Avoid spicy, greasy and heavy food, as these may make you sick after the anesthesia.  ° ° °General Anesthesia, Adult, Care After  °Refer to this sheet in the next few weeks. These instructions provide you with information on caring for yourself after your procedure. Your health care provider may also give you more specific instructions. Your treatment has been planned according to current medical practices, but problems sometimes occur. Call your health care provider if you have any problems or questions after your procedure.  °WHAT TO EXPECT AFTER THE PROCEDURE  °After the procedure, it is typical to experience:  °Sleepiness.  °Nausea and vomiting. °HOME CARE INSTRUCTIONS  °For the first 24 hours after general anesthesia:  °Have a responsible person with you.  °Do not drive a car. If you are alone, do not take public transportation.  °Do not drink alcohol.  °Do not take medicine that has not been prescribed by your health care provider.  °Do not sign important papers or make important decisions.  °You may resume a normal diet and activities as directed by your health care provider.  °Change bandages (dressings) as directed.  °If you have questions or problems that seem related to general anesthesia, call the hospital and ask for the anesthetist or anesthesiologist on call. °SEEK MEDICAL CARE IF:  °You have nausea and vomiting that continue the day after anesthesia.  °You develop a rash. °SEEK IMMEDIATE MEDICAL CARE IF:  °You have difficulty breathing.  °You have chest pain.  °You have any allergic problems. °Document Released: 07/09/2000 Document Revised: 12/03/2012 Document Reviewed: 10/16/2012  °ExitCare® Patient Information ©2014 [employer].  ° ° °

## 2013-10-02 NOTE — Anesthesia Procedure Notes (Signed)
Procedure Name: Intubation Date/Time: 10/02/2013 9:19 AM Performed by: Reine JustFLOWERS, ROKOSHI T Pre-anesthesia Checklist: Patient identified, Timeout performed, Emergency Drugs available, Suction available and Patient being monitored Patient Re-evaluated:Patient Re-evaluated prior to inductionOxygen Delivery Method: Circle system utilized and Simple face mask Preoxygenation: Pre-oxygenation with 100% oxygen Intubation Type: IV induction Ventilation: Mask ventilation without difficulty Laryngoscope Size: Mac and 4 Grade View: Grade I Nasal Tubes: Left, Nasal prep performed and Magill forceps- large, utilized Tube size: 7.5 mm Number of attempts: 1 Airway Equipment and Method: Patient positioned with wedge pillow Placement Confirmation: ETT inserted through vocal cords under direct vision,  positive ETCO2 and breath sounds checked- equal and bilateral Tube secured with: Tape Dental Injury: Teeth and Oropharynx as per pre-operative assessment

## 2013-10-02 NOTE — Transfer of Care (Signed)
Immediate Anesthesia Transfer of Care Note  Patient: Kevin Daugherty  Procedure(s) Performed: Procedure(s): MULTIPLE EXTRACTION WITH ALVEOLOPLASTY (N/A)  Patient Location: PACU  Anesthesia Type:General  Level of Consciousness: awake, alert  and oriented  Airway & Oxygen Therapy: Patient Spontanous Breathing and Patient connected to nasal cannula oxygen  Post-op Assessment: Report given to PACU RN and Post -op Vital signs reviewed and stable  Post vital signs: Reviewed and stable  Complications: No apparent anesthesia complications

## 2013-10-02 NOTE — Op Note (Signed)
10/02/2013  10:03 AM  PATIENT:  [name]  38 y.o. male  PRE-OPERATIVE DIAGNOSIS:  NON RESTORABLE TEETH # 3, 4, 6, 7, 8, 9, 12, 13, 14, 20, 21, 22, 23, 24, 25, 26, 27, 28, 29,   POST-OPERATIVE DIAGNOSIS:  SAME  PROCEDURE:  Procedure(s): MULTIPLE EXTRACTION TEETH # 3, 4, 6, 7, 8, 9, 12, 13, 14, 20, 21, 22, 23, 24, 25, 26, 27, 28, 29,  WITH ALVEOLOPLASTY  SURGEON:  Surgeon(s): Georgia LopesScott M Jensen, DDS  ANESTHESIA:   local and general  EBL:  minimal  DRAINS: none   SPECIMEN:  No Specimen  COUNTS:  YES  PLAN OF CARE: Discharge to home after PACU  PATIENT DISPOSITION:  PACU - hemodynamically stable.   PROCEDURE DETAILS: Dictation #[id number]  Georgia LopesScott M. Jensen, DMD 10/02/2013 10:03 AM

## 2013-10-02 NOTE — Progress Notes (Signed)
Received pt from PACU. Refused ice pack.

## 2013-10-03 DIAGNOSIS — K0530 Chronic periodontitis, unspecified: Secondary | ICD-10-CM

## 2013-10-03 DIAGNOSIS — K029 Dental caries, unspecified: Secondary | ICD-10-CM | POA: Diagnosis not present

## 2013-10-03 MED ORDER — AMLODIPINE BESYLATE 5 MG PO TABS: 5.0000 mg | ORAL_TABLET | Freq: Every day | ORAL | Status: AC

## 2013-10-03 NOTE — Progress Notes (Signed)
Patient's IV removed. Given ice pack to take home. Instructions and prescriptions given. Accompanied by wife driving patient home. Escorted out by volunteer services. Trina Aoarla Thompson, RN

## 2013-10-03 NOTE — Progress Notes (Signed)
Pt complained of increased facial swelling. Pt stated he felt ice made the swelling worst. MD paged. Awaiting response.

## 2013-10-03 NOTE — Discharge Summary (Signed)
Physician Discharge Summary  Kevin Daugherty L Axtman [id number] DOB: Feb 25, 1976 DOA: 10/02/2013  PCP: No PCP Per Patient  Admit date: 10/02/2013 Discharge date: 10/03/2013  Recommendations for Outpatient Follow-up:  1. Monitor blood pressure  Discharge Diagnoses:  Principal Problem:   S/P tooth extraction Active Problems:   Malignant hypertension   Bipolar disorder, unspecified  Discharge Condition: stable  Filed Weights   10/02/13 0659 10/02/13 1543  Weight: 122.018 kg (269 lb) 122.244 kg (269 lb 8 oz)    History of present illness:   38 y.o. male  With h/o untreated bipolar disorder and hypertension s/p extraction of multiple teeth. Pain has been difficult to control in PACU, patient now sedated and sats drop to 89%. hospitalits asked to observe overnight for pain control and monitoring for respiratory compromise. Wife reports she thinks pt has sleep apnea, but he has never had polysomnogram. Pt is currently sedated and provides minimal history. Reportedly stopped medications for bipolar and htn months ago. Previously on lithium. Antihypertensive unknown. BP currently 165/118. Pt complaining of pain. Wife reports no h/o chronic opiates or heavy alcohol use.  Hospital Course:  Observed overnight. Pain controlled with NSAIDS and opiates. Tolerating liquids. More alert and no hypoxia.  Given Rx for amlodipine.  [facility]  Procedures:  Extraction of teeth #3, 4, 6, 7, 8, 9, 12, 13, 14, 20, 21,  22, 23, 24, 25, 26, 27, 28, 29. Alveoplasty right and left maxilla and  mandible.  Consultations:  Dr. Barbette MerinoJensen  Discharge Exam: Filed Vitals:   10/03/13 0600  BP: 140/93  Pulse: 123  Temp: 97.6 F (36.4 C)  Resp: 17    General: alert HEENT: lip and cheek swelling Cardiovascular: RRR without MGR Respiratory: CTA without WRR  Discharge Instructions   Call MD for:  difficulty breathing, headache or visual disturbances    Complete by:  As directed       Call MD for:  persistant nausea and vomiting    Complete by:  As directed      Call MD for:  severe uncontrolled pain    Complete by:  As directed      Call MD for:  temperature >100.4    Complete by:  As directed      Diet - low sodium heart healthy    Complete by:  As directed   Soft Diet. Advance as tolerated.     Discharge instructions    Complete by:  As directed   Warm salt water mouth rinses 4-5 times per day starting the day after surgery. Ice to affected area for 2-3 days. Soft diet, advance as tolerated. No smoking for 2 weeks. Follow-up visit with Dr. Barbette MerinoJensen as scheduled. Call [phone] for problems.     Driving Restrictions    Complete by:  As directed   No driving while on pain medication     Increase activity slowly    Complete by:  As directed             Medication List         amLODipine 5 MG tablet  Commonly known as:  NORVASC  Take 1 tablet (5 mg total) by mouth daily.     nicotine 21 mg/24hr patch  Commonly known as:  NICODERM CQ  Place 1 patch (21 mg total) onto the skin daily.     oxyCODONE-acetaminophen 10-325 MG per tablet  Commonly known as:  PERCOCET  Take 1-2 tablets by mouth every 4 (  four) hours as needed for pain.       Allergies  Allergen Reactions  . Dilaudid [Hydromorphone] Nausea Only, Swelling and Palpitations  . Penicillins Swelling       Follow-up Information   Follow up with Georgia LopesJENSEN,SCOTT M, DDS. (as scheduled)    Specialty:  Oral Surgery   Contact information:   [address] [place] [address] [phone]       [facility] (as scheduled to check blood pressure)    Contact information:   [address] [place] [address] [phone]        The results of significant diagnostics from this hospitalization (including imaging, microbiology, ancillary and laboratory) are listed below for reference.    Significant Diagnostic Studies: Dg Chest 2 View  09/25/2013    CLINICAL DATA:  Preoperative teeth extraction  EXAM: CHEST  2 VIEW  COMPARISON:  None.  FINDINGS: The interstitium is slightly prominent in a diffuse manner. There is no frank edema or consolidation. The heart size and pulmonary vascularity are normal. No adenopathy. No bone lesions.  IMPRESSION: Interstitium is slightly prominent any generalized matter, likely reflecting chronic inflammatory type change. There is no frank edema or consolidation.   Electronically Signed   By: Bretta BangWilliam  Woodruff M.D.   On: 09/25/2013 15:04    Microbiology: No results found for this or any previous visit (from the past 240 hour(s)).   Labs: Basic Metabolic Panel: No results found for this basename: NA, K, CL, CO2, GLUCOSE, BUN, CREATININE, CALCIUM, MG, PHOS,  in the last 168 hours Liver Function Tests: No results found for this basename: AST, ALT, ALKPHOS, BILITOT, PROT, ALBUMIN,  in the last 168 hours No results found for this basename: LIPASE, AMYLASE,  in the last 168 hours No results found for this basename: AMMONIA,  in the last 168 hours CBC: No results found for this basename: WBC, NEUTROABS, HGB, HCT, MCV, PLT,  in the last 168 hours Cardiac Enzymes: No results found for this basename: CKTOTAL, CKMB, CKMBINDEX, TROPONINI,  in the last 168 hours BNP: BNP (last 3 results) No results found for this basename: PROBNP,  in the last 8760 hours CBG: No results found for this basename: GLUCAP,  in the last 168 hours     Signed:  SULLIVAN,CORINNA L  [facility] 10/03/2013, 9:29 AM

## 2013-10-05 NOTE — Anesthesia Postprocedure Evaluation (Signed)
  Anesthesia Post-op Note  Patient: Kevin Daugherty  Procedure(s) Performed: Procedure(s): MULTIPLE EXTRACTION WITH ALVEOLOPLASTY (N/A)  Patient Location: PACU  Anesthesia Type:General  Level of Consciousness: awake  Airway and Oxygen Therapy: Patient Spontanous Breathing  Post-op Pain: mild  Post-op Assessment: Post-op Vital signs reviewed  Post-op Vital Signs: Reviewed  Last Vitals:  Filed Vitals:   10/03/13 0600  BP: 140/93  Pulse: 123  Temp: 36.4 C  Resp: 17    Complications: No apparent anesthesia complications

## 2013-10-06 ENCOUNTER — Encounter (HOSPITAL_COMMUNITY): Payer: Self-pay | Admitting: Oral Surgery

## 2015-01-30 IMAGING — CR DG CHEST 2V
2 series · 2 of 2 positions shown · non-contrast
Comparison: None.

CLINICAL DATA: Preoperative teeth extraction

EXAM:
CHEST  2 VIEW

[w chest pa]
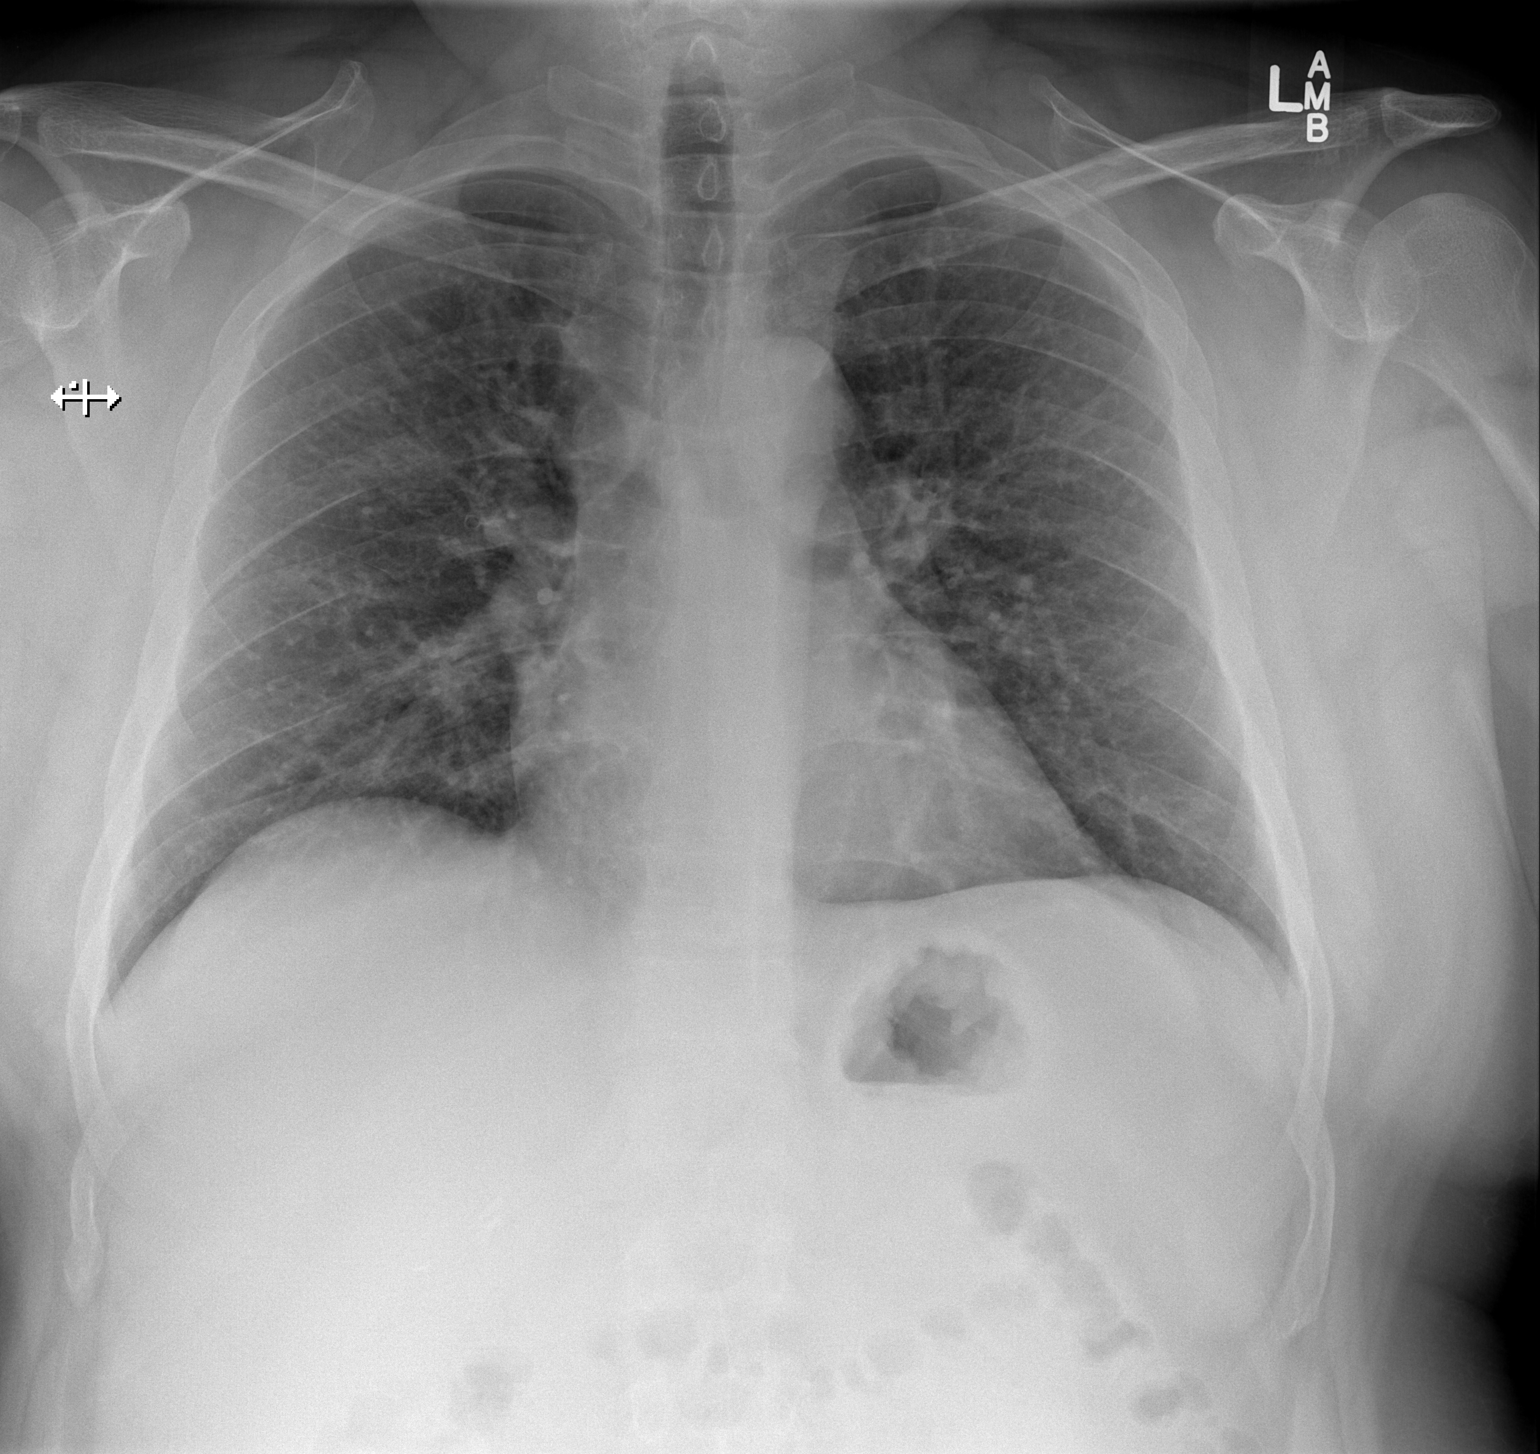

[w chest lat]
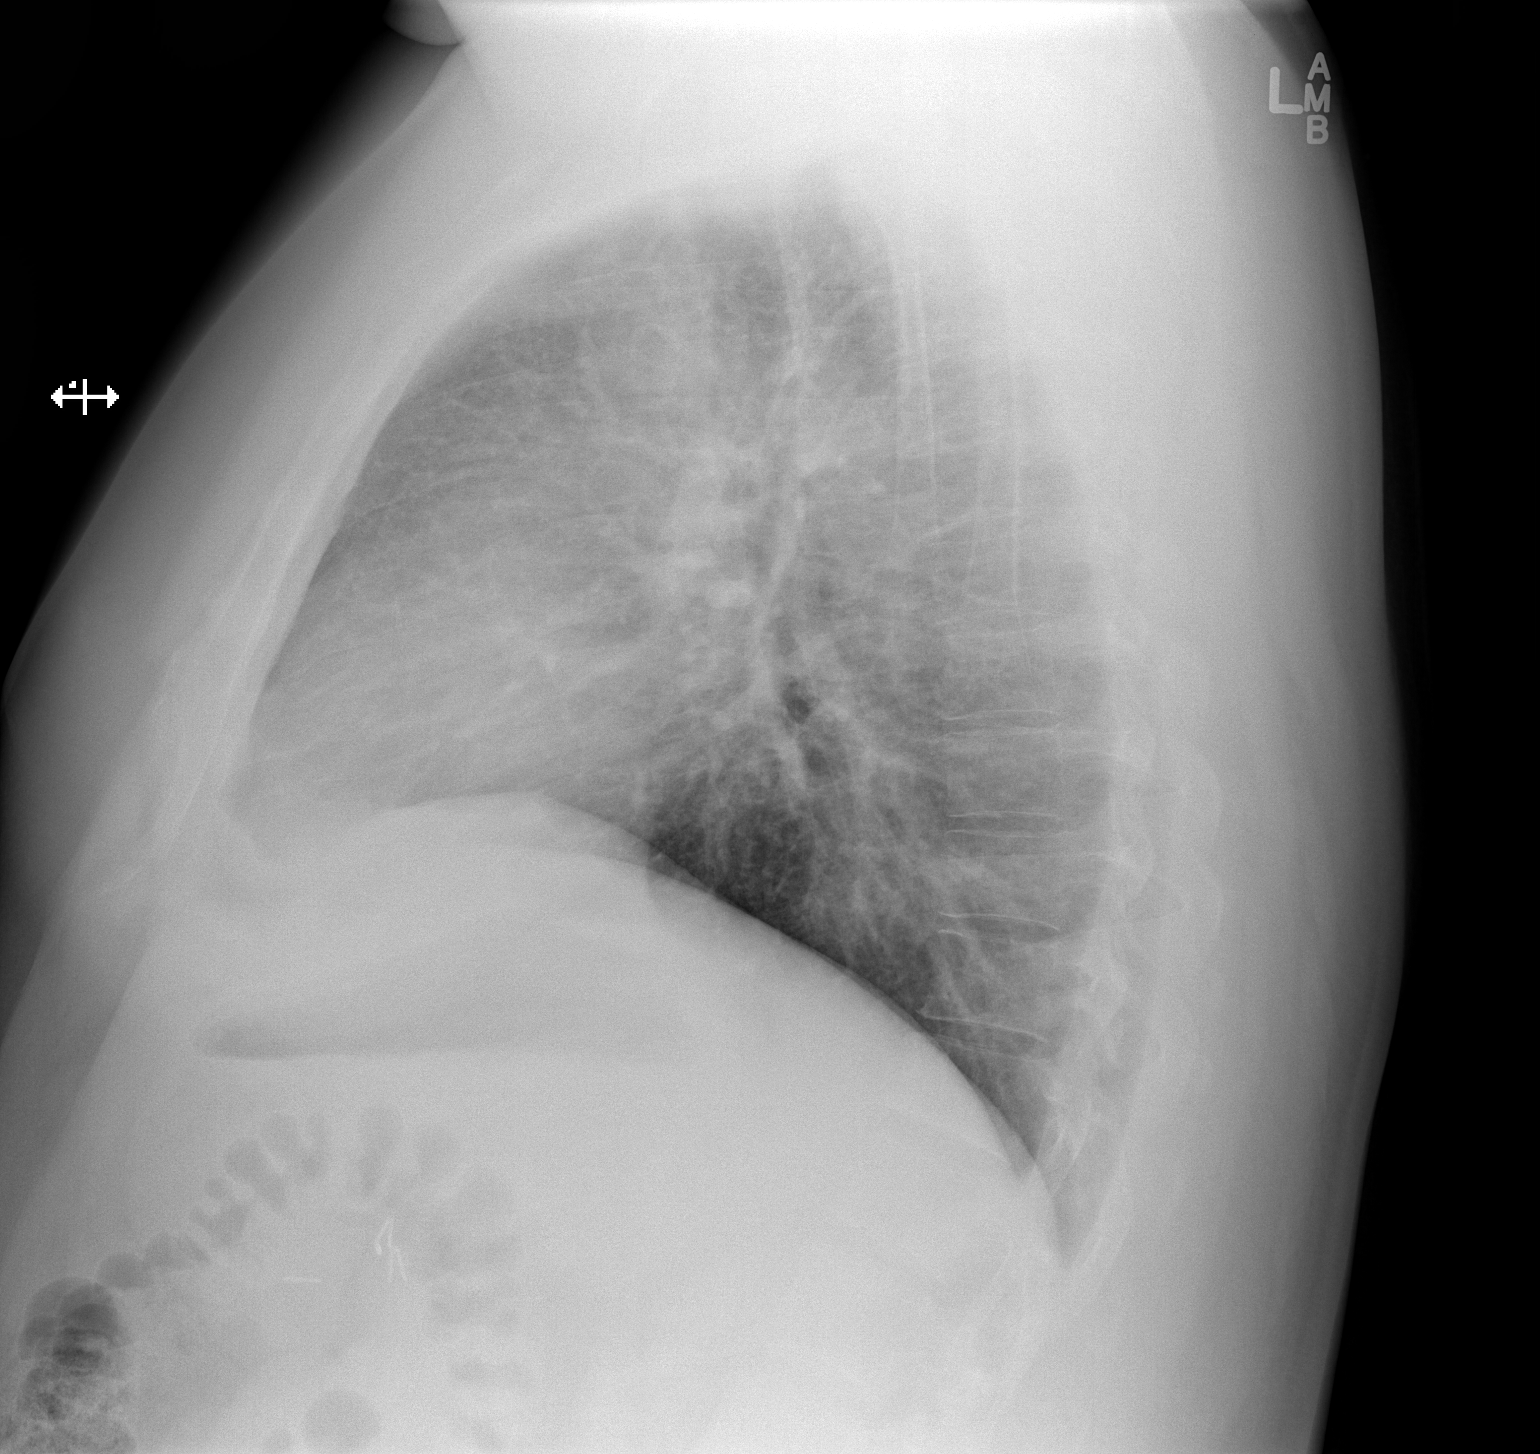

[2 of 2 positions shown; findings below may reference images not displayed]

FINDINGS: The interstitium is slightly prominent in a diffuse manner. There is
no frank edema or consolidation. The heart size and pulmonary
vascularity are normal. No adenopathy. No bone lesions.
IMPRESSION: Interstitium is slightly prominent any generalized matter, likely
reflecting chronic inflammatory type change. There is no frank edema
or consolidation.

## 2021-10-20 ENCOUNTER — Encounter (HOSPITAL_COMMUNITY): Payer: Self-pay

## 2021-10-20 ENCOUNTER — Emergency Department (HOSPITAL_COMMUNITY)
Admission: EM | Admit: 2021-10-20 | Discharge: 2021-10-20 | Disposition: A | Discharge status: Home / Self Care | Payer: No Typology Code available for payment source | Attending: Student | Admitting: Student

## 2021-10-20 ENCOUNTER — Other Ambulatory Visit: Payer: Self-pay

## 2021-10-20 ENCOUNTER — Emergency Department (HOSPITAL_COMMUNITY): Payer: No Typology Code available for payment source

## 2021-10-20 DIAGNOSIS — S20212A Contusion of left front wall of thorax, initial encounter: Secondary | ICD-10-CM | POA: Insufficient documentation

## 2021-10-20 DIAGNOSIS — X501XXA Overexertion from prolonged static or awkward postures, initial encounter: Secondary | ICD-10-CM | POA: Insufficient documentation

## 2021-10-20 DIAGNOSIS — S29001A Unspecified injury of muscle and tendon of front wall of thorax, initial encounter: Secondary | ICD-10-CM | POA: Diagnosis present

## 2021-10-20 MED ORDER — KETOROLAC TROMETHAMINE 15 MG/ML IJ SOLN
15.0000 mg | Freq: Once | INTRAMUSCULAR | Status: AC
  Administered 2021-10-20: 15 mg via INTRAMUSCULAR
  Filled 2021-10-20: qty 1

## 2021-10-20 MED ORDER — OXYCODONE-ACETAMINOPHEN 5-325 MG PO TABS: 1.0000 | ORAL_TABLET | Freq: Four times a day (QID) | ORAL | 0 refills | Status: AC | PRN

## 2021-10-20 MED ORDER — OXYCODONE-ACETAMINOPHEN 5-325 MG PO TABS
2.0000 | ORAL_TABLET | Freq: Once | ORAL | Status: AC
  Administered 2021-10-20: 2 via ORAL
  Filled 2021-10-20: qty 2

## 2021-10-20 MED ORDER — LIDOCAINE 5 % EX PTCH: 1.0000 | MEDICATED_PATCH | CUTANEOUS | 0 refills | Status: AC

## 2021-10-20 MED ORDER — LIDOCAINE 5 % EX PTCH
1.0000 | MEDICATED_PATCH | CUTANEOUS | Status: DC
  Administered 2021-10-20: 1 via TRANSDERMAL
  Filled 2021-10-20: qty 1
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
## 2021-10-20 NOTE — Discharge Instructions (Addendum)
Please use incentive spirometer a few times per day to prevent developing pneumonia.  Please take 600 mg ibuprofen every 6 hours as needed for pain and use narcotic pain medication for breakthrough pain.  Lidocaine patches every 12 hours as needed for pain.  Please return to the [facility] apartment for any worsening symptoms you might have.

## 2021-10-20 NOTE — ED Triage Notes (Signed)
Pt arrived via POV c/o left rib cage pain from last week. Pt reports laying on his roof last weekend to install security cameras and rolled over and felt immediate pain to left rib cage. Pt denies falling and hitting rib cage on anything. Pt unsure if he bruised his ribs or broke his ribs.

## 2022-02-12 ENCOUNTER — Emergency Department (HOSPITAL_COMMUNITY)
Admission: EM | Admit: 2022-02-12 | Discharge: 2022-02-12 | Discharge status: Against Medical Advice | Payer: No Typology Code available for payment source | Attending: Emergency Medicine | Admitting: Emergency Medicine

## 2022-02-12 ENCOUNTER — Other Ambulatory Visit: Payer: Self-pay

## 2022-02-12 ENCOUNTER — Encounter (HOSPITAL_COMMUNITY): Payer: Self-pay

## 2022-02-12 DIAGNOSIS — Z5321 Procedure and treatment not carried out due to patient leaving prior to being seen by health care provider: Secondary | ICD-10-CM | POA: Diagnosis not present

## 2022-02-12 DIAGNOSIS — W268XXA Contact with other sharp object(s), not elsewhere classified, initial encounter: Secondary | ICD-10-CM | POA: Diagnosis not present

## 2022-02-12 DIAGNOSIS — S61412A Laceration without foreign body of left hand, initial encounter: Secondary | ICD-10-CM | POA: Diagnosis present

## 2022-02-12 NOTE — ED Notes (Signed)
Patient stated he did not want to wait any longer. Patient left from fast track without being seen.
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
## 2022-11-10 ENCOUNTER — Emergency Department (HOSPITAL_COMMUNITY): Payer: No Typology Code available for payment source

## 2022-11-10 ENCOUNTER — Other Ambulatory Visit: Payer: Self-pay

## 2022-11-10 ENCOUNTER — Emergency Department (HOSPITAL_COMMUNITY)
Admission: EM | Admit: 2022-11-10 | Discharge: 2022-11-10 | Disposition: A | Discharge status: Home / Self Care | Payer: No Typology Code available for payment source | Attending: Emergency Medicine | Admitting: Emergency Medicine

## 2022-11-10 ENCOUNTER — Encounter (HOSPITAL_COMMUNITY): Payer: Self-pay | Admitting: *Deleted

## 2022-11-10 DIAGNOSIS — F17200 Nicotine dependence, unspecified, uncomplicated: Secondary | ICD-10-CM | POA: Diagnosis not present

## 2022-11-10 DIAGNOSIS — I10 Essential (primary) hypertension: Secondary | ICD-10-CM | POA: Insufficient documentation

## 2022-11-10 DIAGNOSIS — R55 Syncope and collapse: Secondary | ICD-10-CM | POA: Insufficient documentation

## 2022-11-10 DIAGNOSIS — Z79899 Other long term (current) drug therapy: Secondary | ICD-10-CM | POA: Insufficient documentation

## 2022-11-10 LAB — CBC
HCT: 46.4 % (ref 39.0–52.0)
Hemoglobin: 16 g/dL (ref 13.0–17.0)
MCH: 30.8 pg (ref 26.0–34.0)
MCHC: 34.5 g/dL (ref 30.0–36.0)
MCV: 89.2 fL (ref 80.0–100.0)
Platelets: 261 10*3/uL (ref 150–400)
RBC: 5.2 MIL/uL (ref 4.22–5.81)
RDW: 12.6 % (ref 11.5–15.5)
WBC: 11.2 10*3/uL — ABNORMAL HIGH (ref 4.0–10.5)
nRBC: 0 % (ref 0.0–0.2)

## 2022-11-10 LAB — BASIC METABOLIC PANEL WITH GFR
Anion gap: 11 (ref 5–15)
BUN: 11 mg/dL (ref 6–20)
CO2: 19 mmol/L — ABNORMAL LOW (ref 22–32)
Calcium: 9.3 mg/dL (ref 8.9–10.3)
Chloride: 106 mmol/L (ref 98–111)
Creatinine, Ser: 0.74 mg/dL (ref 0.61–1.24)
GFR, Estimated: >60 mL/min
Glucose, Bld: 136 mg/dL — ABNORMAL HIGH (ref 70–99)
Potassium: 4.3 mmol/L (ref 3.5–5.1)
Sodium: 136 mmol/L (ref 135–145)

## 2022-11-10 LAB — TROPONIN I (HIGH SENSITIVITY): Troponin I (High Sensitivity): <2 ng/L

## 2022-11-10 MED ORDER — NAPROXEN 500 MG PO TABS: 500.0000 mg | ORAL_TABLET | Freq: Two times a day (BID) | ORAL | 0 refills | Status: AC

## 2022-11-10 MED ORDER — KETOROLAC TROMETHAMINE 60 MG/2ML IM SOLN
60.0000 mg | Freq: Once | INTRAMUSCULAR | Status: AC
  Administered 2022-11-10: 60 mg via INTRAMUSCULAR
  Filled 2022-11-10: qty 2
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
## 2022-11-10 NOTE — Discharge Instructions (Addendum)
Pleasure taking care of you today.  You are seen today after passing out.  Your EKG, blood work and chest x-ray were all reassuring.  Please follow-up closely with your primary care doctor.  Your symptoms seem to be brought on by stress.  Please try to relax avoid stressful situations.  Come back to the ER if you have any new or worsening symptoms specially if you have chest pain, shortness of breath, dizziness or passout again.
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
